# Patient Record
Sex: Male | Born: 1985 | Race: White | Hispanic: No | Marital: Married | Smoking: Never smoker
Health system: Southern US, Community
[De-identification: ages and names within clinical notes are randomized; demographics above are authoritative.]

## PROBLEM LIST (undated history)

## (undated) HISTORY — PX: KNEE SURGERY: SHX244

---

## 2018-03-18 ENCOUNTER — Other Ambulatory Visit: Payer: Self-pay

## 2018-03-18 ENCOUNTER — Ambulatory Visit (INDEPENDENT_AMBULATORY_CARE_PROVIDER_SITE_OTHER): Payer: Self-pay

## 2018-03-18 ENCOUNTER — Ambulatory Visit
Admission: EM | Admit: 2018-03-18 | Discharge: 2018-03-18 | Disposition: A | Discharge status: Home / Self Care | Payer: Self-pay | Attending: Family Medicine | Admitting: Family Medicine

## 2018-03-18 ENCOUNTER — Encounter: Payer: Self-pay | Admitting: Emergency Medicine

## 2018-03-18 DIAGNOSIS — R197 Diarrhea, unspecified: Secondary | ICD-10-CM

## 2018-03-18 DIAGNOSIS — R1030 Lower abdominal pain, unspecified: Secondary | ICD-10-CM

## 2018-03-18 DIAGNOSIS — N200 Calculus of kidney: Secondary | ICD-10-CM

## 2018-03-18 DIAGNOSIS — R300 Dysuria: Secondary | ICD-10-CM

## 2018-03-18 DIAGNOSIS — R1110 Vomiting, unspecified: Secondary | ICD-10-CM

## 2018-03-18 LAB — URINALYSIS, COMPLETE (UACMP) WITH MICROSCOPIC
Bacteria, UA: NONE SEEN
Glucose, UA: NEGATIVE mg/dL
Ketones, ur: 40 mg/dL — AB
Leukocytes, UA: NEGATIVE
Nitrite: NEGATIVE
Protein, ur: 30 mg/dL — AB
Specific Gravity, Urine: 1.015 (ref 1.005–1.030)
Squamous Epithelial / LPF: NONE SEEN
pH: 6 (ref 5.0–8.0)

## 2018-03-18 LAB — COMPREHENSIVE METABOLIC PANEL
ALBUMIN: 4.2 g/dL (ref 3.5–5.0)
ALK PHOS: 84 U/L (ref 38–126)
ALT: 20 U/L (ref 17–63)
AST: 19 U/L (ref 15–41)
Anion gap: 9 (ref 5–15)
BILIRUBIN TOTAL: 3.1 mg/dL — AB (ref 0.3–1.2)
BUN: 19 mg/dL (ref 6–20)
CALCIUM: 9.1 mg/dL (ref 8.9–10.3)
CO2: 26 mmol/L (ref 22–32)
Chloride: 99 mmol/L — ABNORMAL LOW (ref 101–111)
Creatinine, Ser: 1.05 mg/dL (ref 0.61–1.24)
GFR calc Af Amer: >60 mL/min (ref >60)
GFR calc non Af Amer: >60 mL/min (ref >60)
GLUCOSE: 101 mg/dL — AB (ref 65–99)
Potassium: 4.3 mmol/L (ref 3.5–5.1)
Sodium: 134 mmol/L — ABNORMAL LOW (ref 135–145)
TOTAL PROTEIN: 8.1 g/dL (ref 6.5–8.1)

## 2018-03-18 LAB — CBC WITH DIFFERENTIAL/PLATELET
Basophils Absolute: 0.1 10*3/uL (ref 0–0.1)
Basophils Relative: 1 %
Eosinophils Absolute: 0 10*3/uL (ref 0–0.7)
Eosinophils Relative: 0 %
HCT: 46.8 % (ref 40.0–52.0)
Hemoglobin: 16.3 g/dL (ref 13.0–18.0)
Lymphocytes Relative: 7 %
Lymphs Abs: 1.4 10*3/uL (ref 1.0–3.6)
MCH: 29.8 pg (ref 26.0–34.0)
MCHC: 34.7 g/dL (ref 32.0–36.0)
MCV: 85.9 fL (ref 80.0–100.0)
Monocytes Absolute: 2 10*3/uL — ABNORMAL HIGH (ref 0.2–1.0)
Monocytes Relative: 10 %
Neutro Abs: 15.9 10*3/uL — ABNORMAL HIGH (ref 1.4–6.5)
Neutrophils Relative %: 82 %
Platelets: 207 10*3/uL (ref 150–440)
RBC: 5.46 MIL/uL (ref 4.40–5.90)
RDW: 13.1 % (ref 11.5–14.5)
WBC: 19.5 10*3/uL — ABNORMAL HIGH (ref 3.8–10.6)

## 2018-03-18 MED ORDER — CIPROFLOXACIN HCL 500 MG PO TABS: 500.0000 mg | ORAL_TABLET | Freq: Two times a day (BID) | ORAL | 0 refills | Status: AC

## 2018-03-18 MED ORDER — TAMSULOSIN HCL 0.4 MG PO CAPS: 0.4000 mg | ORAL_CAPSULE | Freq: Every day | ORAL | 0 refills | Status: AC

## 2018-03-18 MED ORDER — ONDANSETRON 8 MG PO TBDP: 8.0000 mg | ORAL_TABLET | Freq: Three times a day (TID) | ORAL | 0 refills | Status: AC | PRN

## 2018-03-18 MED ORDER — HYDROCODONE-ACETAMINOPHEN 5-325 MG PO TABS: ORAL_TABLET | ORAL | 0 refills | Status: AC

## 2018-03-18 NOTE — ED Triage Notes (Signed)
Patient c/o lower abdominal pain that started on Saturday.  Patient c/o nausea.  Patient denies V/D.  Patient recently traveled to [place].  Patient reports bodyaches and chills.

## 2018-03-18 NOTE — ED Provider Notes (Signed)
[facility]    CSN: [id number] Arrival date & time: 03/18/18  1154     History   Chief Complaint Chief Complaint  Patient presents with  . Abdominal Pain    HPI Cesar Ellis is a 32 y.o. male.   32 yo male with a c/o lower abdominal pain for 4 days, associated with diarrhea and vomiting initially but none for the past 2 days. States he's also been having chills. States he just return from a trip to [place] 2 weeks ago. Also complains of burning and pain discomfort with urination, as well as darker color urine for the past 4-5 days.    The history is provided by the patient.    History reviewed. No pertinent past medical history.  There are no active problems to display for this patient.   Past Surgical History:  Procedure Laterality Date  . KNEE SURGERY Right        Home Medications    Prior to Admission medications   Medication Sig Start Date End Date Taking? Authorizing Provider  ciprofloxacin (CIPRO) 500 MG tablet Take 1 tablet (500 mg total) by mouth every 12 (twelve) hours. 03/18/18   Payton Mccallum, MD  HYDROcodone-acetaminophen (NORCO/VICODIN) 5-325 MG tablet 1-2 tabs po q 8 hours prn 03/18/18   Payton Mccallum, MD  ondansetron (ZOFRAN ODT) 8 MG disintegrating tablet Take 1 tablet (8 mg total) by mouth every 8 (eight) hours as needed. 03/18/18   Payton Mccallum, MD  tamsulosin (FLOMAX) 0.4 MG CAPS capsule Take 1 capsule (0.4 mg total) by mouth daily. 03/18/18   Payton Mccallum, MD    Family History History reviewed. No pertinent family history.  Social History Social History   Tobacco Use  . Smoking status: Never Smoker  . Smokeless tobacco: Never Used  Substance Use Topics  . Alcohol use: No    Frequency: Never  . Drug use: Not on file     Allergies   Patient has no known allergies.   Review of Systems Review of Systems   Physical Exam Triage Vital Signs ED Triage Vitals  Enc Vitals Group     BP 03/18/18 1210 133/75   Pulse Rate 03/18/18 1210 88     Resp 03/18/18 1210 16     Temp 03/18/18 1210 98.7 F (37.1 C)     Temp Source 03/18/18 1210 Oral     SpO2 03/18/18 1210 97 %     Weight 03/18/18 1206 200 lb (90.7 kg)     Height 03/18/18 1206 6' (1.829 m)     Head Circumference --      Peak Flow --      Pain Score 03/18/18 1206 4     Pain Loc --      Pain Edu? --      Excl. in GC? --    No data found.  Updated Vital Signs BP 133/75 (BP Location: Left Arm)   Pulse 88   Temp 98.7 F (37.1 C) (Oral)   Resp 16   Ht 6' (1.829 m)   Wt 200 lb (90.7 kg)   SpO2 97%   BMI 27.12 kg/m   Visual Acuity Right Eye Distance:   Left Eye Distance:   Bilateral Distance:    Right Eye Near:   Left Eye Near:    Bilateral Near:     Physical Exam  Constitutional: He is oriented to person, place, and time. He appears well-developed and well-nourished. No distress.  HENT:  Head: Normocephalic and atraumatic.  Cardiovascular: Normal rate, regular rhythm, normal heart sounds and intact distal pulses.  No murmur heard. Pulmonary/Chest: Effort normal and breath sounds normal. No respiratory distress. He has no wheezes. He has no rales.  Abdominal: Soft. Bowel sounds are normal. He exhibits no distension and no mass. There is tenderness (mild; mid lower and left lower quadrant). There is no rebound and no guarding.  Neurological: He is alert and oriented to person, place, and time.  Skin: No rash noted. He is not diaphoretic.  Nursing note and vitals reviewed.    UC Treatments / Results  Labs (all labs ordered are listed, but only abnormal results are displayed) Labs Reviewed  COMPREHENSIVE METABOLIC PANEL - Abnormal; Notable for the following components:      Result Value   Sodium 134 (*)    Chloride 99 (*)    Glucose, Bld 101 (*)    Total Bilirubin 3.1 (*)    All other components within normal limits  CBC WITH DIFFERENTIAL/PLATELET - Abnormal; Notable for the following components:   WBC 19.5 (*)     Neutro Abs 15.9 (*)    Monocytes Absolute 2.0 (*)    All other components within normal limits  URINALYSIS, COMPLETE (UACMP) WITH MICROSCOPIC - Abnormal; Notable for the following components:   Color, Urine AMBER (*)    APPearance HAZY (*)    Hgb urine dipstick MODERATE (*)    Bilirubin Urine SMALL (*)    Ketones, ur 40 (*)    Protein, ur 30 (*)    All other components within normal limits  GASTROINTESTINAL PANEL BY PCR, STOOL (REPLACES STOOL CULTURE)    EKG  EKG Interpretation None       Radiology Dg Abd 2 Views  Result Date: 03/18/2018 CLINICAL DATA:  Mid and lower abdominal pain for the past 5 days associated with nausea, chills, and diarrhea. Patient went to [place] 2 weeks ago. The patient reports a metallic taste in his mouth. EXAM: ABDOMEN - 2 VIEW COMPARISON:  None in PACs FINDINGS: The bowel gas pattern is within the limits of normal. There is subtle radiodensity projecting over the inferior aspect of the right renal pelvis is not clearly within the kidney however. No other abnormal soft tissue calcifications are observed. The bony structures are unremarkable. IMPRESSION: Radiodensity in the right upper quadrant could reflect a gallstone. Less likely would be a kidney or proximal ureteral stone. No evidence of bowel obstruction, ileus, or gastroenteritis. Electronically Signed   By: David  [place] M.D.   On: 03/18/2018 13:07    Procedures Procedures (including critical care time)  Medications Ordered in UC Medications - No data to display   Initial Impression / Assessment and Plan / UC Course  I have reviewed the triage vital signs and the nursing notes.  Pertinent labs & imaging results that were available during my care of the patient were reviewed by me and considered in my medical decision making (see chart for details).       Final Clinical Impressions(s) / UC Diagnoses   Final diagnoses:  Lower abdominal pain  Diarrhea, unspecified type  Nephrolithiasis     ED Discharge Orders        Ordered    ciprofloxacin (CIPRO) 500 MG tablet  Every 12 hours     03/18/18 1338    ondansetron (ZOFRAN ODT) 8 MG disintegrating tablet  Every 8 hours PRN     03/18/18 1348    HYDROcodone-acetaminophen (NORCO/VICODIN) 5-325 MG tablet     03/18/18  1422    tamsulosin (FLOMAX) 0.4 MG CAPS capsule  Daily     03/18/18 1444     1. Labs/x-ray results and diagnosis reviewed with patient 2. rx as per orders above; reviewed possible side effects, interactions, risks and benefits  3. Recommend supportive treatment with clear liquids/increase fluids then advance slowly as tolerated 4. Check stool/GI panel   5. Follow-up prn if symptoms worsen or don't improve  Controlled Substance Prescriptions [place] Controlled Substance Registry consulted? Not Applicable   Payton Mccallumonty, Courtnie Brenes, MD 03/18/18 [phone]

## 2018-03-19 LAB — GASTROINTESTINAL PANEL BY PCR, STOOL (REPLACES STOOL CULTURE)
ASTROVIRUS: NOT DETECTED
Adenovirus F40/41: NOT DETECTED
Campylobacter species: NOT DETECTED
Cryptosporidium: NOT DETECTED
Cyclospora cayetanensis: NOT DETECTED
ENTEROAGGREGATIVE E COLI (EAEC): NOT DETECTED
ENTEROTOXIGENIC E COLI (ETEC): NOT DETECTED
Entamoeba histolytica: NOT DETECTED
Enteropathogenic E coli (EPEC): NOT DETECTED
GIARDIA LAMBLIA: NOT DETECTED
NOROVIRUS GI/GII: NOT DETECTED
Plesimonas shigelloides: NOT DETECTED
ROTAVIRUS A: NOT DETECTED
SALMONELLA SPECIES: NOT DETECTED
SHIGA LIKE TOXIN PRODUCING E COLI (STEC): NOT DETECTED
SHIGELLA/ENTEROINVASIVE E COLI (EIEC): NOT DETECTED
Sapovirus (I, II, IV, and V): NOT DETECTED
VIBRIO CHOLERAE: NOT DETECTED
Vibrio species: NOT DETECTED
Yersinia enterocolitica: NOT DETECTED

## 2019-03-22 IMAGING — CR DG ABDOMEN 2V
3 series · 3 of 3 positions shown · non-contrast
Comparison: None in PACs

CLINICAL DATA: Mid and lower abdominal pain for the past 5 days
associated with nausea, chills, and diarrhea. Patient went to [place] 2
weeks ago. The patient reports a metallic taste in his mouth.

EXAM:
ABDOMEN - 2 VIEW

[abdomen erect]
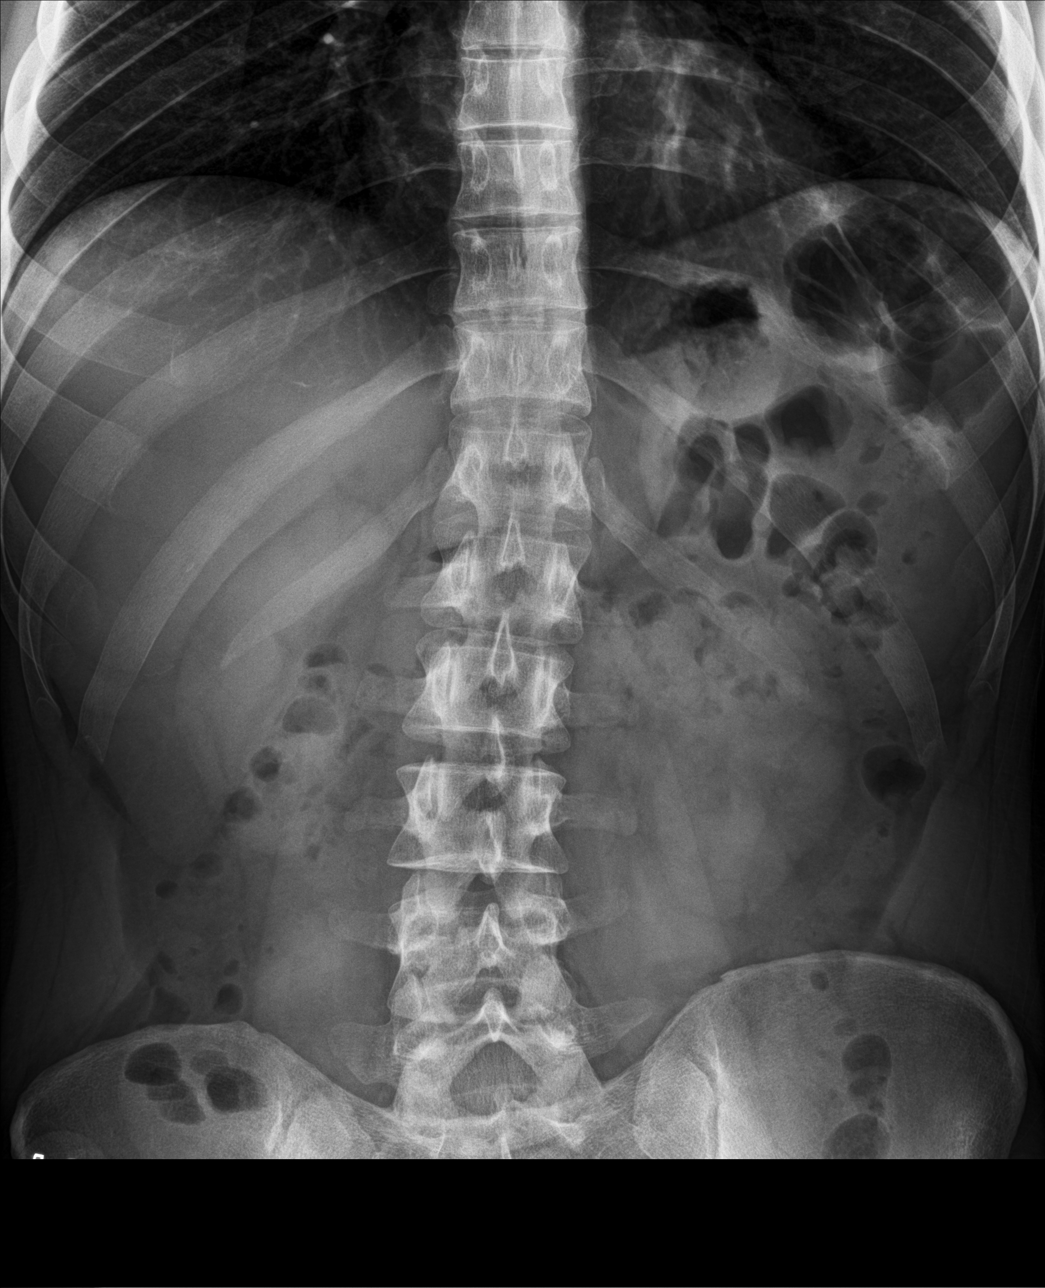

[abdomen supine (1 of 2)]
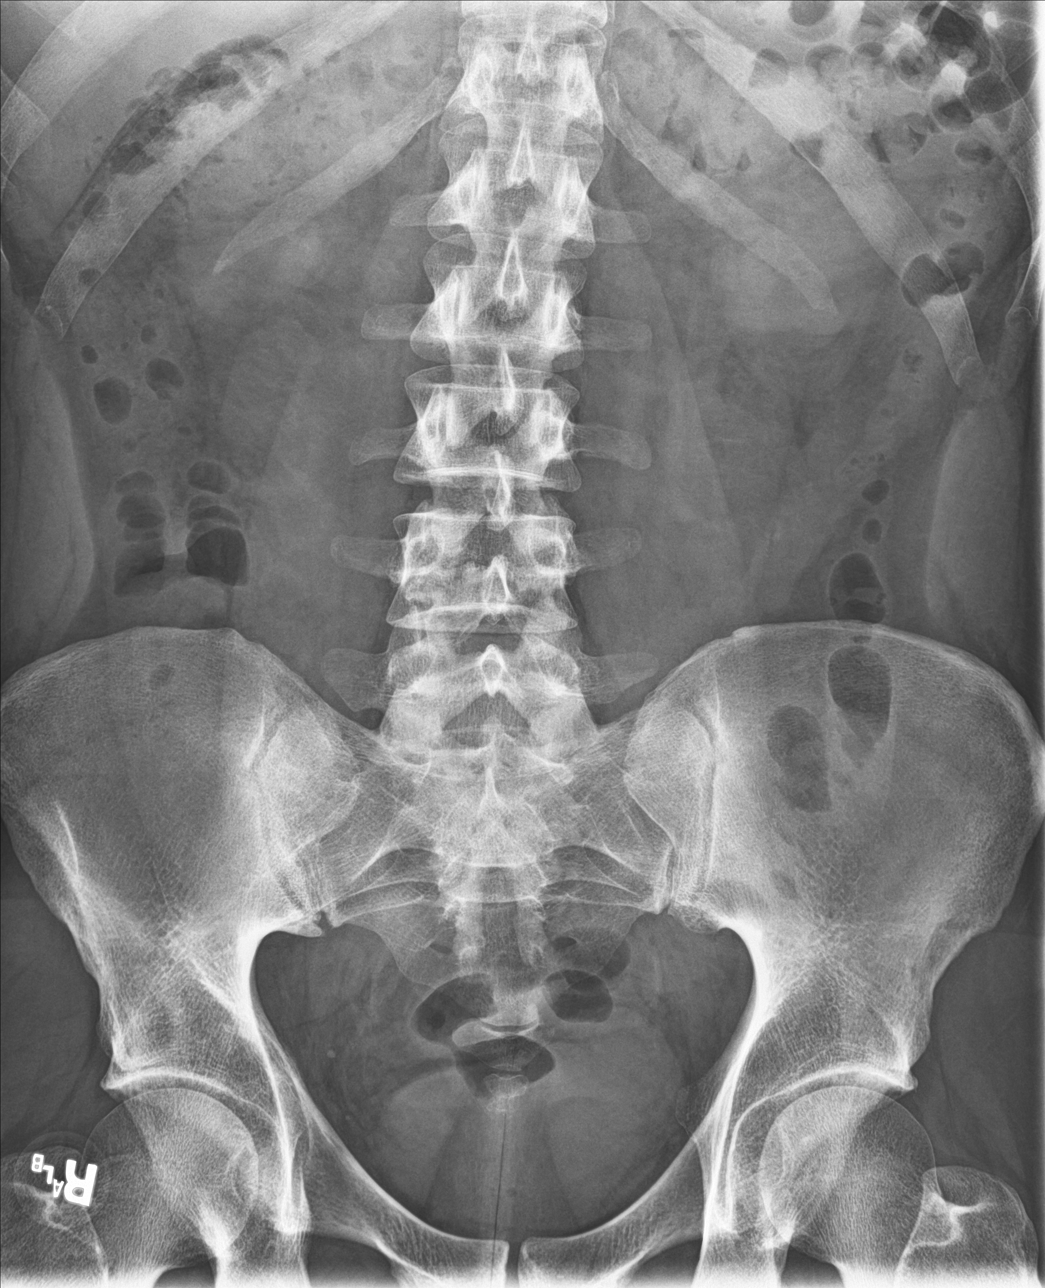

[abdomen supine (2 of 2)]
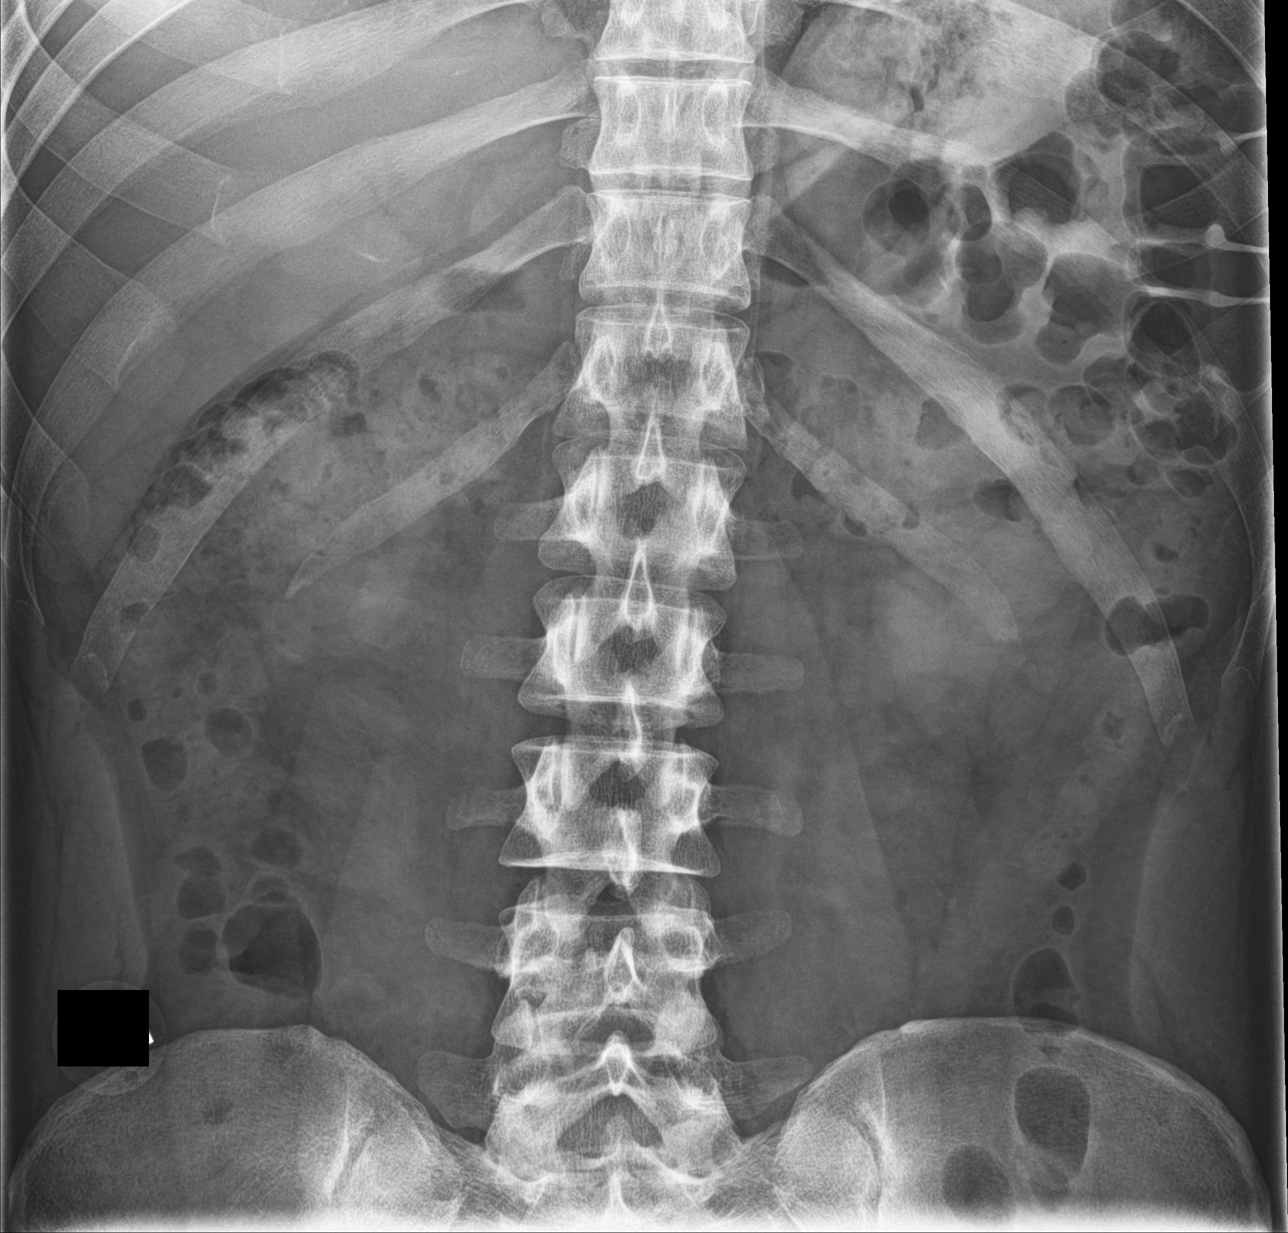

[3 of 3 positions shown; findings below may reference images not displayed]

FINDINGS: The bowel gas pattern is within the limits of normal. There is
subtle radiodensity projecting over the inferior aspect of the right
renal pelvis is not clearly within the kidney however. No other
abnormal soft tissue calcifications are observed. The bony
structures are unremarkable.
IMPRESSION: Radiodensity in the right upper quadrant could reflect a gallstone.
Less likely would be a kidney or proximal ureteral stone. No
evidence of bowel obstruction, ileus, or gastroenteritis.
